# Patient Record
Sex: Female | Born: 1965 | Race: Black or African American | Hispanic: No | Marital: Single | Smoking: Never smoker
Health system: Southern US, Community
[De-identification: ages and names within clinical notes are randomized; demographics above are authoritative.]

---

## 2008-10-16 ENCOUNTER — Emergency Department (HOSPITAL_BASED_OUTPATIENT_CLINIC_OR_DEPARTMENT_OTHER): Admission: EM | Admit: 2008-10-16 | Discharge: 2008-10-16 | Payer: Self-pay | Admitting: Emergency Medicine

## 2009-02-24 ENCOUNTER — Ambulatory Visit: Payer: Self-pay | Admitting: Radiology

## 2009-02-24 ENCOUNTER — Emergency Department (HOSPITAL_BASED_OUTPATIENT_CLINIC_OR_DEPARTMENT_OTHER): Admission: EM | Admit: 2009-02-24 | Discharge: 2009-02-24 | Payer: Self-pay | Admitting: Emergency Medicine

## 2016-11-13 ENCOUNTER — Emergency Department (HOSPITAL_BASED_OUTPATIENT_CLINIC_OR_DEPARTMENT_OTHER)
Admission: EM | Admit: 2016-11-13 | Discharge: 2016-11-14 | Disposition: A | Discharge status: Home / Self Care | Payer: Self-pay | Attending: Emergency Medicine | Admitting: Emergency Medicine

## 2016-11-13 ENCOUNTER — Encounter (HOSPITAL_BASED_OUTPATIENT_CLINIC_OR_DEPARTMENT_OTHER): Payer: Self-pay | Admitting: *Deleted

## 2016-11-13 ENCOUNTER — Emergency Department (HOSPITAL_BASED_OUTPATIENT_CLINIC_OR_DEPARTMENT_OTHER): Payer: Self-pay

## 2016-11-13 DIAGNOSIS — Y939 Activity, unspecified: Secondary | ICD-10-CM | POA: Insufficient documentation

## 2016-11-13 DIAGNOSIS — Y999 Unspecified external cause status: Secondary | ICD-10-CM | POA: Insufficient documentation

## 2016-11-13 DIAGNOSIS — X509XXA Other and unspecified overexertion or strenuous movements or postures, initial encounter: Secondary | ICD-10-CM | POA: Insufficient documentation

## 2016-11-13 DIAGNOSIS — S72414A Nondisplaced unspecified condyle fracture of lower end of right femur, initial encounter for closed fracture: Secondary | ICD-10-CM

## 2016-11-13 DIAGNOSIS — Y929 Unspecified place or not applicable: Secondary | ICD-10-CM | POA: Insufficient documentation

## 2016-11-13 MED ORDER — HYDROCODONE-ACETAMINOPHEN 5-325 MG PO TABS
1.0000 | ORAL_TABLET | Freq: Once | ORAL | Status: AC
  Administered 2016-11-13: 1 via ORAL
  Filled 2016-11-13: qty 1

## 2016-11-13 MED ORDER — IBUPROFEN 400 MG PO TABS
400.0000 mg | ORAL_TABLET | Freq: Once | ORAL | Status: AC | PRN
  Administered 2016-11-13: 400 mg via ORAL
  Filled 2016-11-13: qty 1

## 2016-11-13 NOTE — ED Triage Notes (Signed)
She tripped and fell. Injury to her right knee.

## 2016-11-13 NOTE — ED Notes (Signed)
Patient transported to X-ray 

## 2016-11-13 NOTE — ED Notes (Signed)
Patient transported to CT 

## 2016-11-13 NOTE — ED Provider Notes (Signed)
[facility] Provider [facility]   CSN: [id number] Arrival date & time: 11/13/16  2141  By signing my name below, I, Rosana Fret, attest that this documentation has been prepared under the direction and in the presence of Pricilla Loveless, MD.Electronically Signed: Rosana Fret, ED Scribe. 11/13/16. 10:26 PM.  History   Chief Complaint Chief Complaint  Patient presents with  . Fall  . Knee Pain   The history is provided by the patient. No language interpreter was used.   HPI Comments: Krista Ho is an otherwise healthy 51 y.o. female who presents to the Emergency Department complaining of sudden onset, moderate right knee pain s/p a mechanical. Ground-level fall that occurred tonight. Pt states she tripped over a curb, twisting her knee. No head injury or LOC.  Pt was able to walk but with pain. Pt reports associated swelling to the area. No treatments tried PTA. Pt denies numbness/tingling or any other complaints at this time.  History reviewed. No pertinent past medical history.  There are no active problems to display for this patient.   History reviewed. No pertinent surgical history.  OB History    No data available       Home Medications    Prior to Admission medications   Medication Sig Start Date End Date Taking? Authorizing Provider  HYDROcodone-acetaminophen (NORCO) 5-325 MG tablet Take 1-2 tablets by mouth every 4 (four) hours as needed for severe pain. 11/14/16   Pricilla Loveless, MD    Family History No family history on file.  Social History Social History  Substance Use Topics  . Smoking status: Never Smoker  . Smokeless tobacco: Never Used  . Alcohol use No     Allergies   Patient has no known allergies.   Review of Systems Review of Systems  Musculoskeletal: Positive for arthralgias, joint swelling and myalgias.  Neurological: Negative for syncope and numbness.     Physical Exam Updated Vital Signs BP (!) 113/97   Pulse  93   Temp 99.2 F (37.3 C) (Oral)   Resp 20   Ht 5' 6.5" (1.689 m)   Wt 90.7 kg (200 lb)   LMP 10/14/2016   SpO2 100%   BMI 31.80 kg/m   Physical Exam  Constitutional: She is oriented to person, place, and time. She appears well-developed and well-nourished.  HENT:  Head: Normocephalic and atraumatic.  Right Ear: External ear normal.  Left Ear: External ear normal.  Nose: Nose normal.  Eyes: Right eye exhibits no discharge. Left eye exhibits no discharge.  Cardiovascular: Normal rate and regular rhythm.   Pulmonary/Chest: Effort normal.  Musculoskeletal: Normal range of motion. She exhibits edema and tenderness. She exhibits no deformity.  Mild infrapatellar swelling of the right knee. Point tenderness to the right medial inferior knee. No patella tenderness. Normal active and passive ROM. No ligament laxity. 2+ DP pulse. Normal sensation and strength in RLE.   Neurological: She is alert and oriented to person, place, and time.  Skin: Skin is warm and dry.  Nursing note and vitals reviewed.    ED Treatments / Results  DIAGNOSTIC STUDIES: Oxygen Saturation is 100% on RA, normal by my interpretation.   COORDINATION OF CARE: 10:24 PM-Discussed next steps with pt including RICE protocol. Pt verbalized understanding and is agreeable with the plan.   Labs (all labs ordered are listed, but only abnormal results are displayed) Labs Reviewed - No data to display  EKG  EKG Interpretation None       Radiology  Ct Knee Right Wo Contrast  Result Date: 11/14/2016 CLINICAL DATA:  Right knee pain after recent fall. EXAM: CT OF THE RIGHT KNEE WITHOUT CONTRAST TECHNIQUE: Multidetector CT imaging of the RIGHT knee was performed according to the standard protocol. Multiplanar CT image reconstructions were also generated. COMPARISON:  Same day radiographs of the right knee. FINDINGS: Bones/Joint/Cartilage There is an acute impaction fracture involving the anterior cortex of the medial  femoral condyle with approximately 4 mm of depression noted, series 9, image 43. The area of impaction measures approximately 16 x 11 x 13 mm. There is a tiny avulsion fracture involving the posterior tibial plateau possibly involving the base of the posterior cruciate ligament, series 9, image 38. Small suprapatellar joint effusion. Degenerative subchondral cysts are noted along the posterior aspect of the lateral tibial plateau with subcortical degenerative cysts involving the posterior aspect of the medial femoral condyle. Ligaments Suboptimally assessed by CT. Muscles and Tendons Negative Soft tissues Negative IMPRESSION: 1. Acute impaction fracture involving the anterior cortex of the medial femoral condyle with approximately 4 mm of depression noted. The affected area covers approximately 16 x 11 x 13 mm of the surface. 2. Tiny avulsion fracture off the posteromesial tibial plateau along the posterior aspect of the PCL insertion. Electronically Signed   By: Tollie Eth M.D.   On: 11/14/2016 00:08   Dg Knee Complete 4 Views Right  Result Date: 11/13/2016 CLINICAL DATA:  Right anterior knee pain after fall 30 minutes ago. EXAM: RIGHT KNEE - COMPLETE 4+ VIEW COMPARISON:  None. FINDINGS: Slight medial femorotibial joint space narrowing with small to moderate joint effusion. Subchondral degenerate cystic change noted deep to the lateral tibial plateau with spurring off the lateral tibial plateau noted. Small rim of osteophytes are suggested about the medial tibial plateau. A subtle mild impaction injury to the medial tibial plateau is not excluded that may account for this appearance however no definite fracture lucencies are seen. A noncontrast CT can be performed for further correlation as deemed clinically necessary IMPRESSION: Small to moderate joint effusion. Osteoarthritis of the femorotibial compartment. Small rim of bone along the periphery the medial tibial plateau likely represents a small rim of  osteophytes. A subtle impaction fracture might also account for this appearance but no definite fracture lucencies are seen. A CT can be performed as clinically necessary. Electronically Signed   By: Tollie Eth M.D.   On: 11/13/2016 22:22    Procedures Procedures (including critical care time)  Medications Ordered in ED Medications  ibuprofen (ADVIL,MOTRIN) tablet 400 mg (400 mg Oral Given 11/13/16 2218)  HYDROcodone-acetaminophen (NORCO/VICODIN) 5-325 MG per tablet 1 tablet (1 tablet Oral Given 11/13/16 2358)     Initial Impression / Assessment and Plan / ED Course  I have reviewed the triage vital signs and the nursing notes.  Pertinent labs & imaging results that were available during my care of the patient were reviewed by me and considered in my medical decision making (see chart for details).     CT of the knee shows small femoral condyle impaction fracture and possible posterior medial tibial plateau fracture. Discussed with orthopedics, Dr. Eulah Pont who has reviewed the images. He recommends knee immobilizer, no weightbearing, and pain control. He wants to see the patient in his office this week but at this point does not think there needs to be in acute surgical intervention. Otherwise has mild knee swelling but no significant swelling in her compartments or firm compartments. Discussed return precautions.  Final Clinical  Impressions(s) / ED Diagnoses   Final diagnoses:  Traumatic closed nondisplaced fracture of femoral condyle, right, initial encounter (HCC)    New Prescriptions New Prescriptions   HYDROCODONE-ACETAMINOPHEN (NORCO) 5-325 MG TABLET    Take 1-2 tablets by mouth every 4 (four) hours as needed for severe pain.   I personally performed the services described in this documentation, which was scribed in my presence. The recorded information has been reviewed and is accurate.     Pricilla LovelessGoldston, Dailynn Nancarrow, MD 11/14/16 [phone]

## 2016-11-14 MED ORDER — HYDROCODONE-ACETAMINOPHEN 5-325 MG PO TABS: 1.0000 | ORAL_TABLET | ORAL | 0 refills | Status: AC | PRN

## 2016-11-14 NOTE — ED Notes (Signed)
Pt was wheeled out in a wheel chair

## 2016-11-14 NOTE — Discharge Instructions (Signed)
Call Dr. Greig RightMurphy's office in the morning to set up an appointment later this week. Wear the knee immobilizer at all times. Do not touch your foot on the ground or bear weight with that leg. Use the crutches when you are walking around. Otherwise keep your knee elevated and apply ice.

## 2018-08-03 IMAGING — CT CT KNEE*R* W/O CM
3 of 4 series · 15 of 33 positions shown, 18 images · non-contrast
Comparison: Same day radiographs of the right knee.

CLINICAL DATA: Right knee pain after recent fall.

EXAM:
CT OF THE RIGHT KNEE WITHOUT CONTRAST
TECHNIQUE: Multidetector CT imaging of the RIGHT knee was performed according
to the standard protocol. Multiplanar CT image reconstructions were
also generated.

[Series 4: axial st · axial · 0.42mm/px · z∈[-274,-106]mm · 7 of 109 slices shown, 9 images]
[im 13/109  soft-tissue]
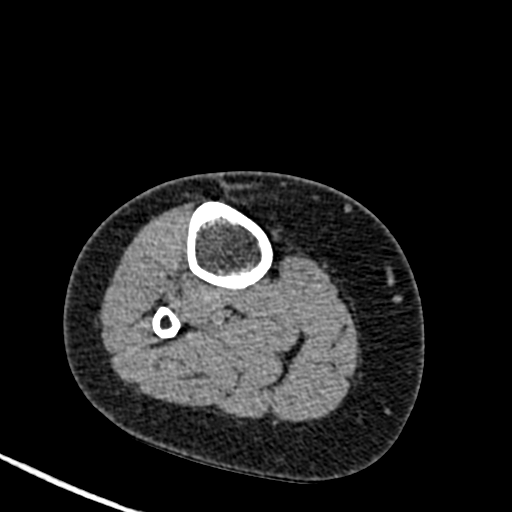
[im 13/109  bone]
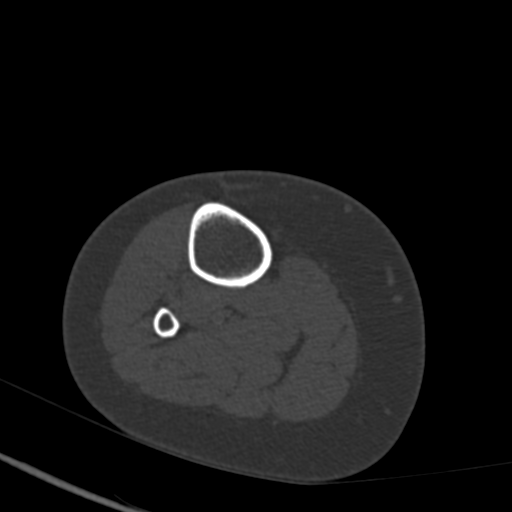
[im 25/109  bone]
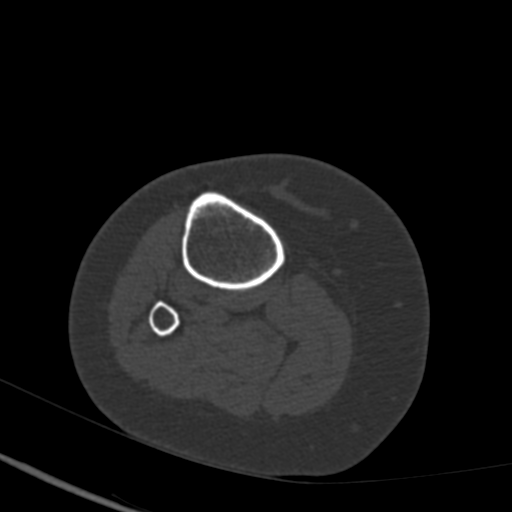
[im 37/109  bone]
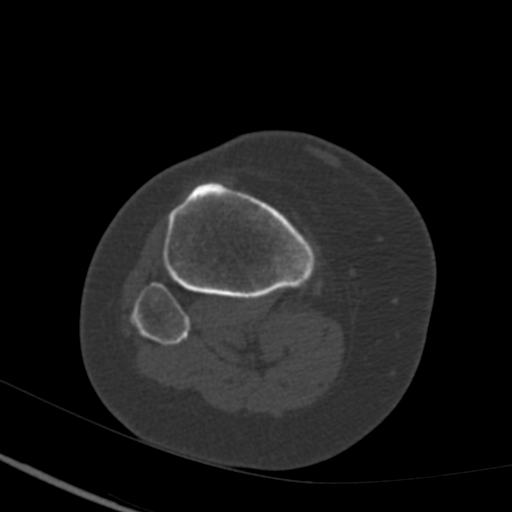
[im 61/109  bone]
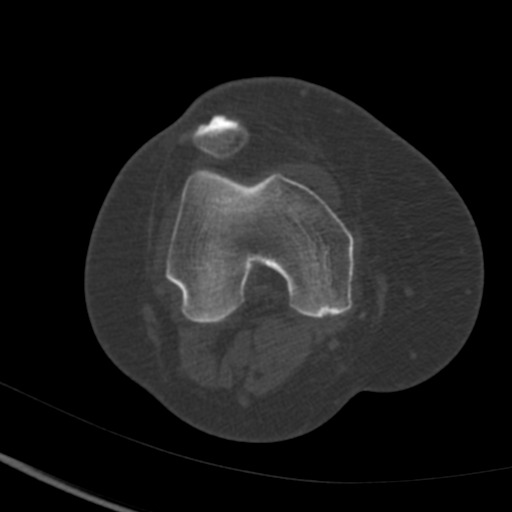
[im 73/109  soft-tissue]
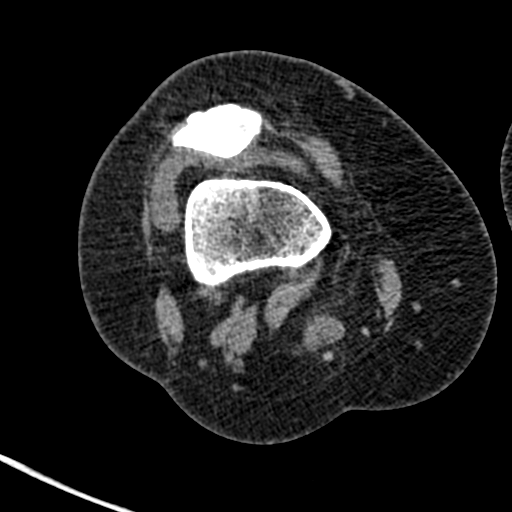
[im 73/109  bone]
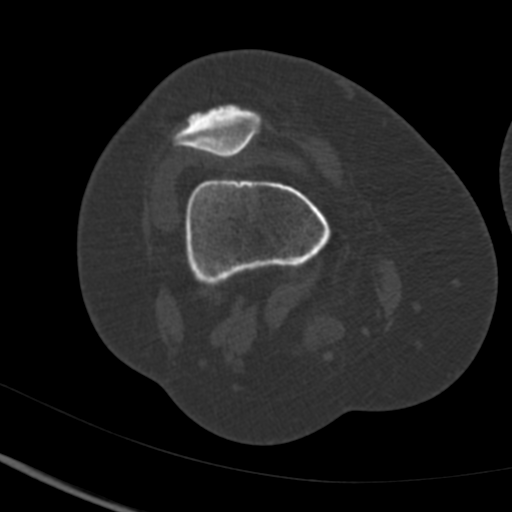
[im 85/109  bone]
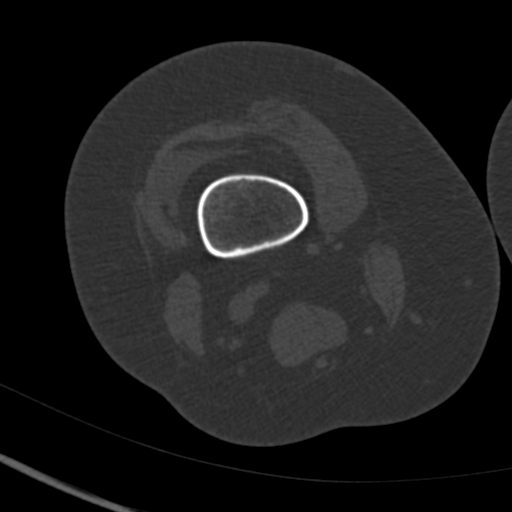
[im 97/109  bone]
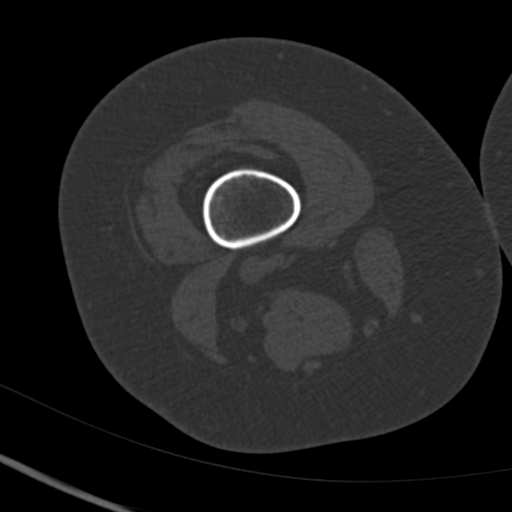

[Series 8: coronal st · coronal · 0.38mm/px · 3 of 91 slices shown]
[im 19/91  bone]
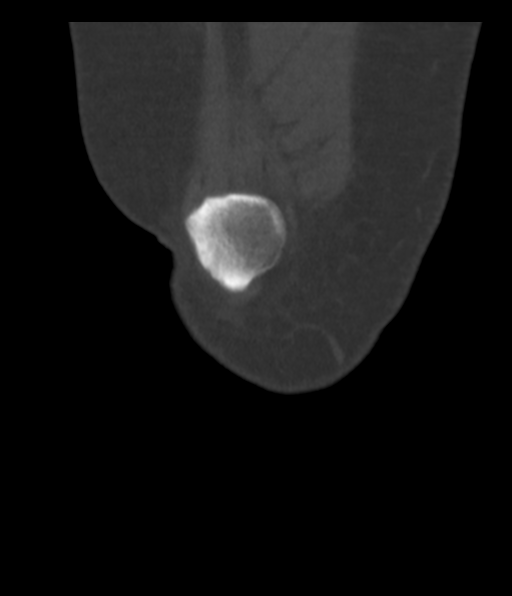
[im 37/91  bone]
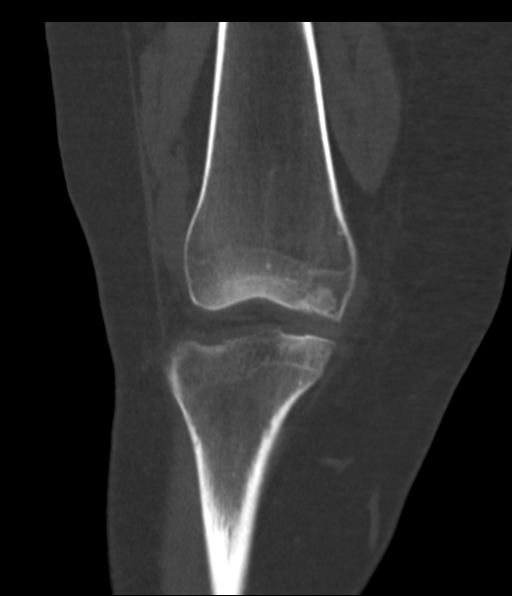
[im 55/91  bone]
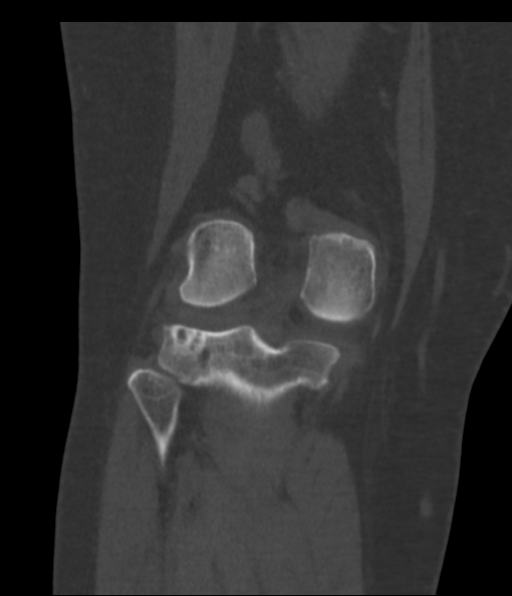

[Series 10: sagittal st · sagittal · 0.39mm/px · 5 of 55 slices shown, 6 images]
[im 19/55  bone]
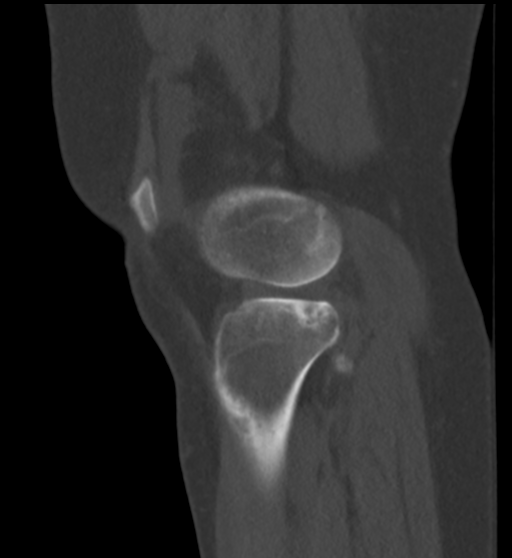
[im 23/55  bone]
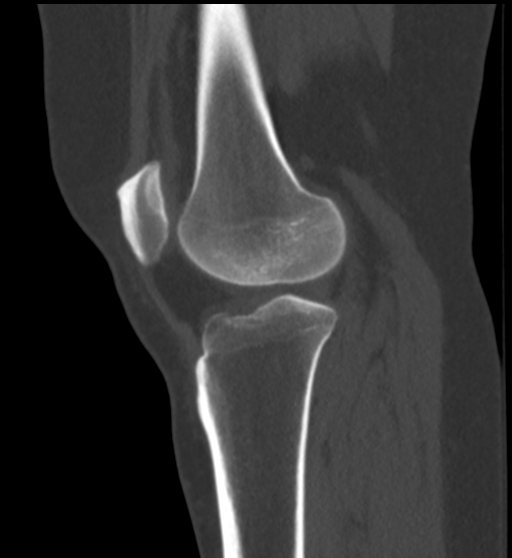
[im 28/55  soft-tissue]
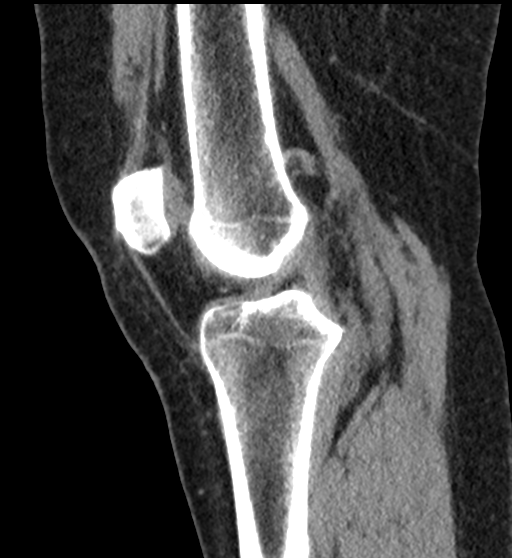
[im 28/55  bone]
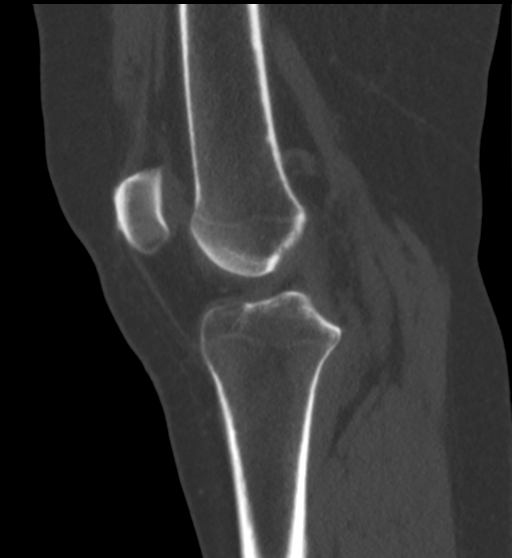
[im 32/55  bone]
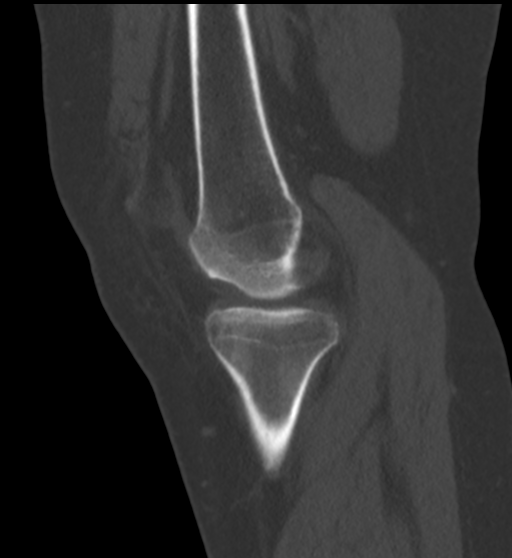
[im 37/55  bone]
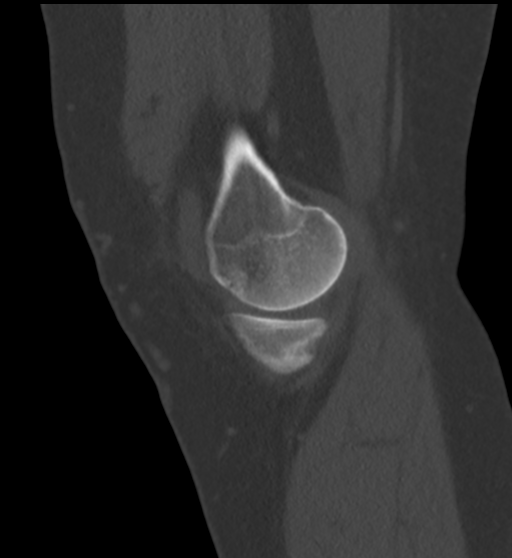

[15 of 33 positions shown; findings below may reference images not displayed]

FINDINGS: Bones/Joint/Cartilage

There is an acute impaction fracture involving the anterior cortex
of the medial femoral condyle with approximately 4 mm of depression
noted, series 9, image 43. The area of impaction measures
approximately 16 x 11 x 13 mm. There is a tiny avulsion fracture
involving the posterior tibial plateau possibly involving the base
of the posterior cruciate ligament, series 9, image 38.

Small suprapatellar joint effusion.

Degenerative subchondral cysts are noted along the posterior aspect
of the lateral tibial plateau with subcortical degenerative cysts
involving the posterior aspect of the medial femoral condyle.

Ligaments

Suboptimally assessed by CT.

Muscles and Tendons

Negative

Soft tissues

Negative
IMPRESSION: 1. Acute impaction fracture involving the anterior cortex of the
medial femoral condyle with approximately 4 mm of depression noted.
The affected area covers approximately 16 x 11 x 13 mm of the
surface.
2. Tiny avulsion fracture off the posteromesial tibial plateau along
the posterior aspect of the PCL insertion.

## 2018-08-03 IMAGING — DX DG KNEE COMPLETE 4+V*R*
4 series · 4 of 4 positions shown · non-contrast
Comparison: None.

CLINICAL DATA: Right anterior knee pain after fall 30 minutes ago.

EXAM:
RIGHT KNEE - COMPLETE 4+ VIEW

[knee ap]
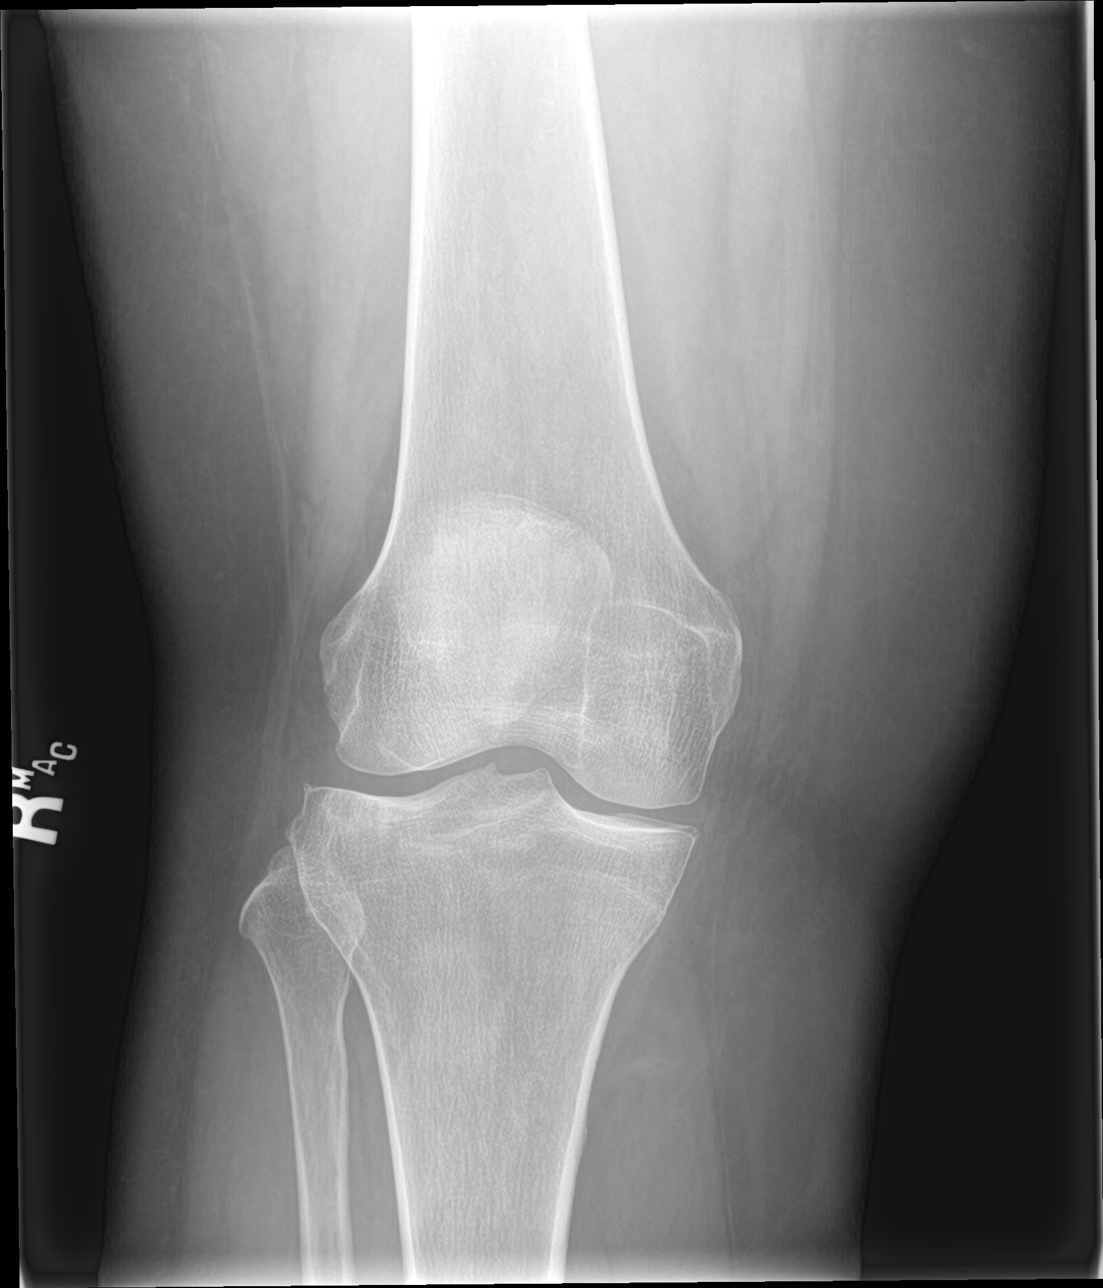

[knee lat]
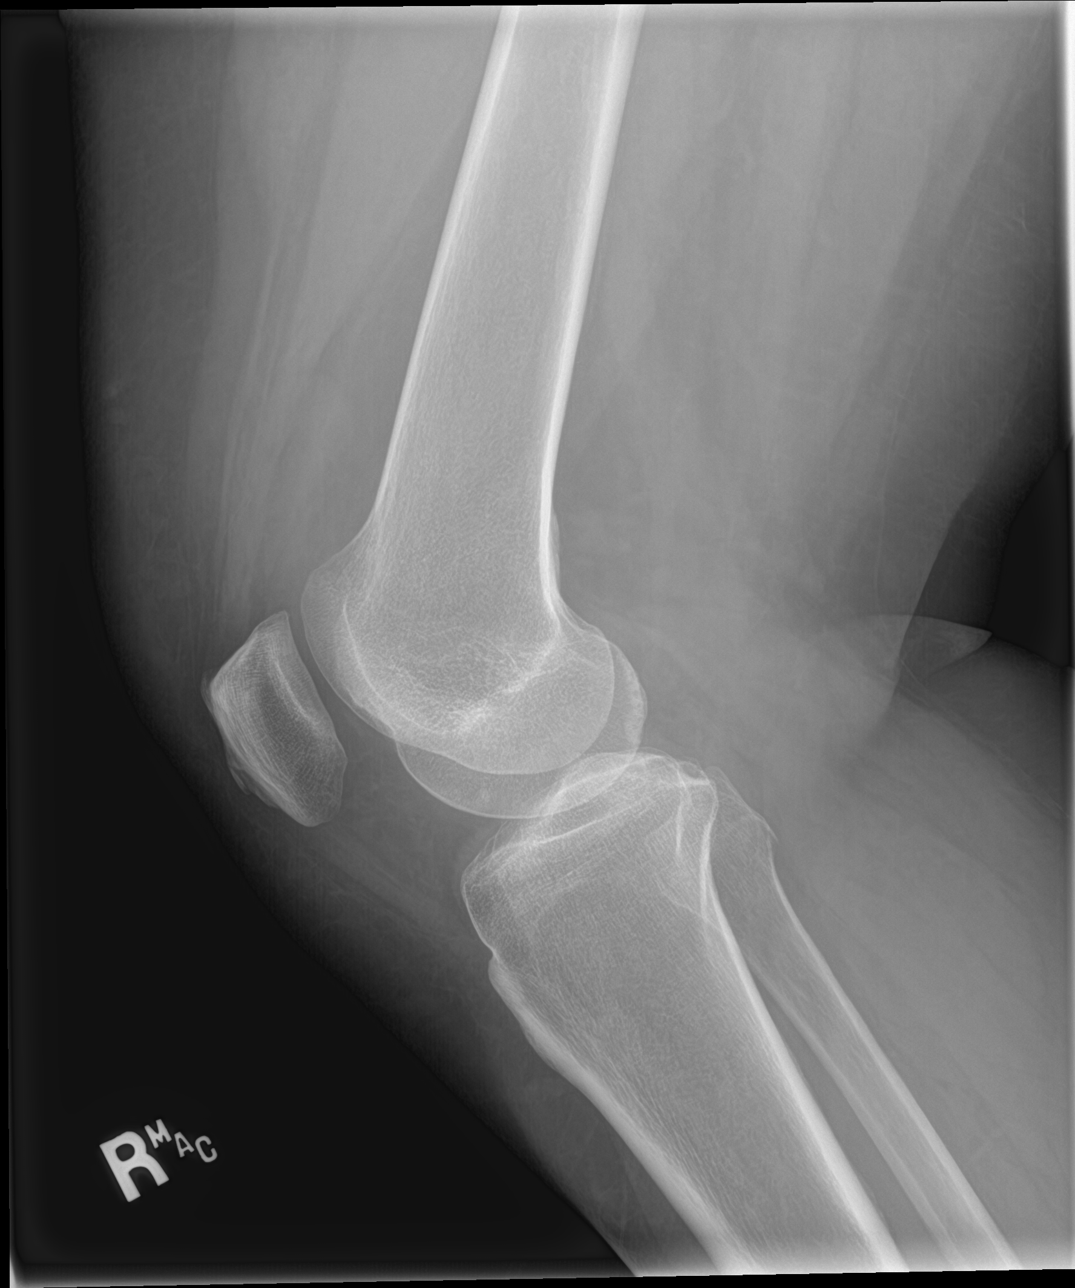

[knee obl (1 of 2)]
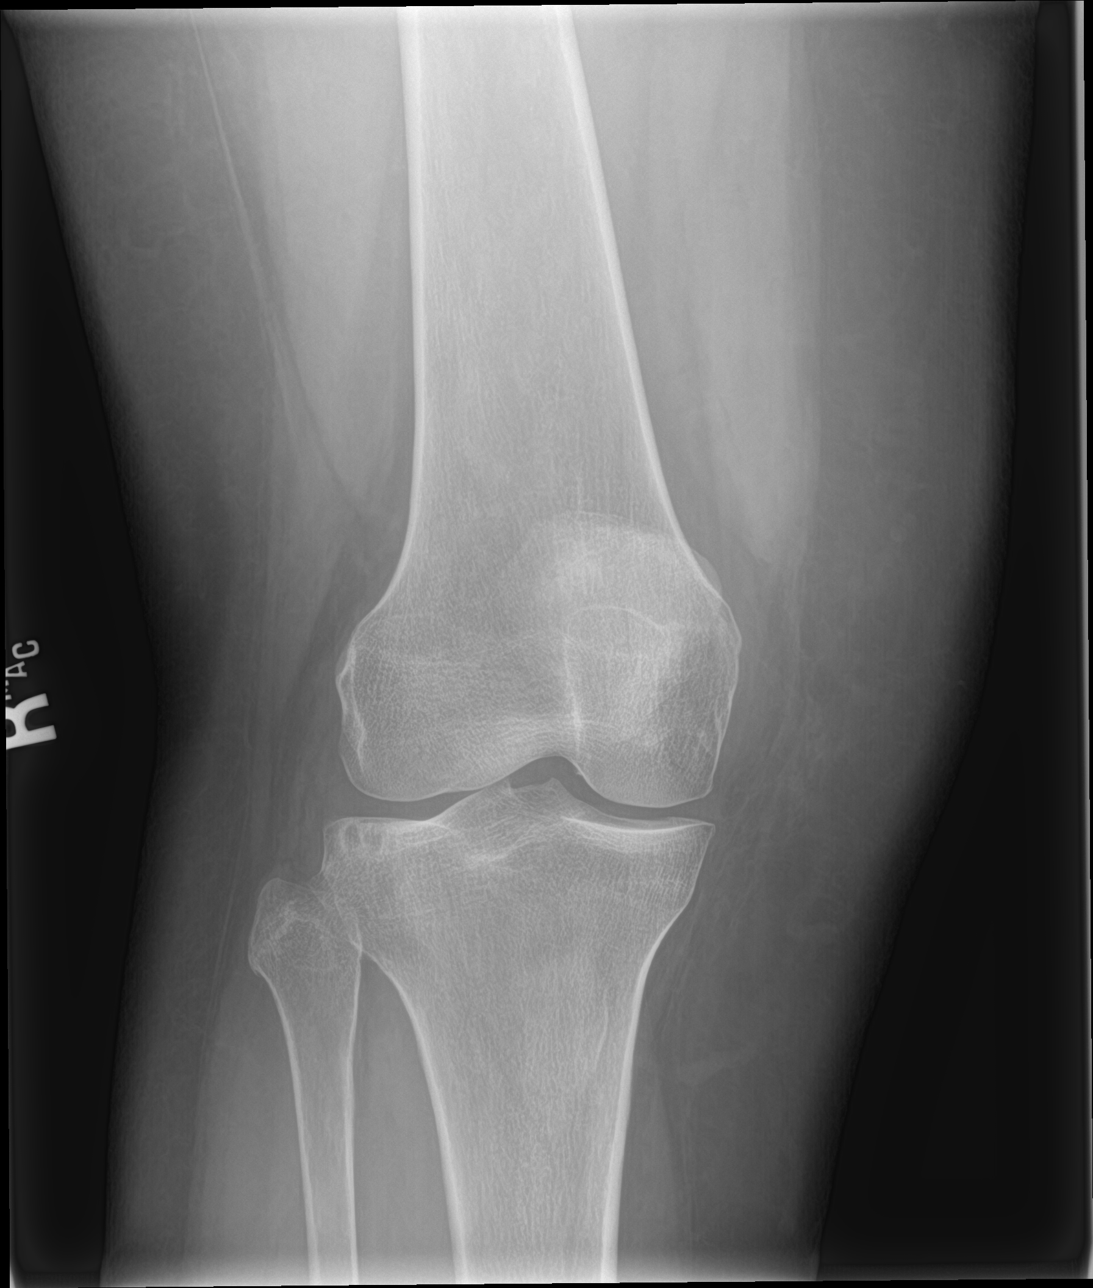

[knee obl (2 of 2)]
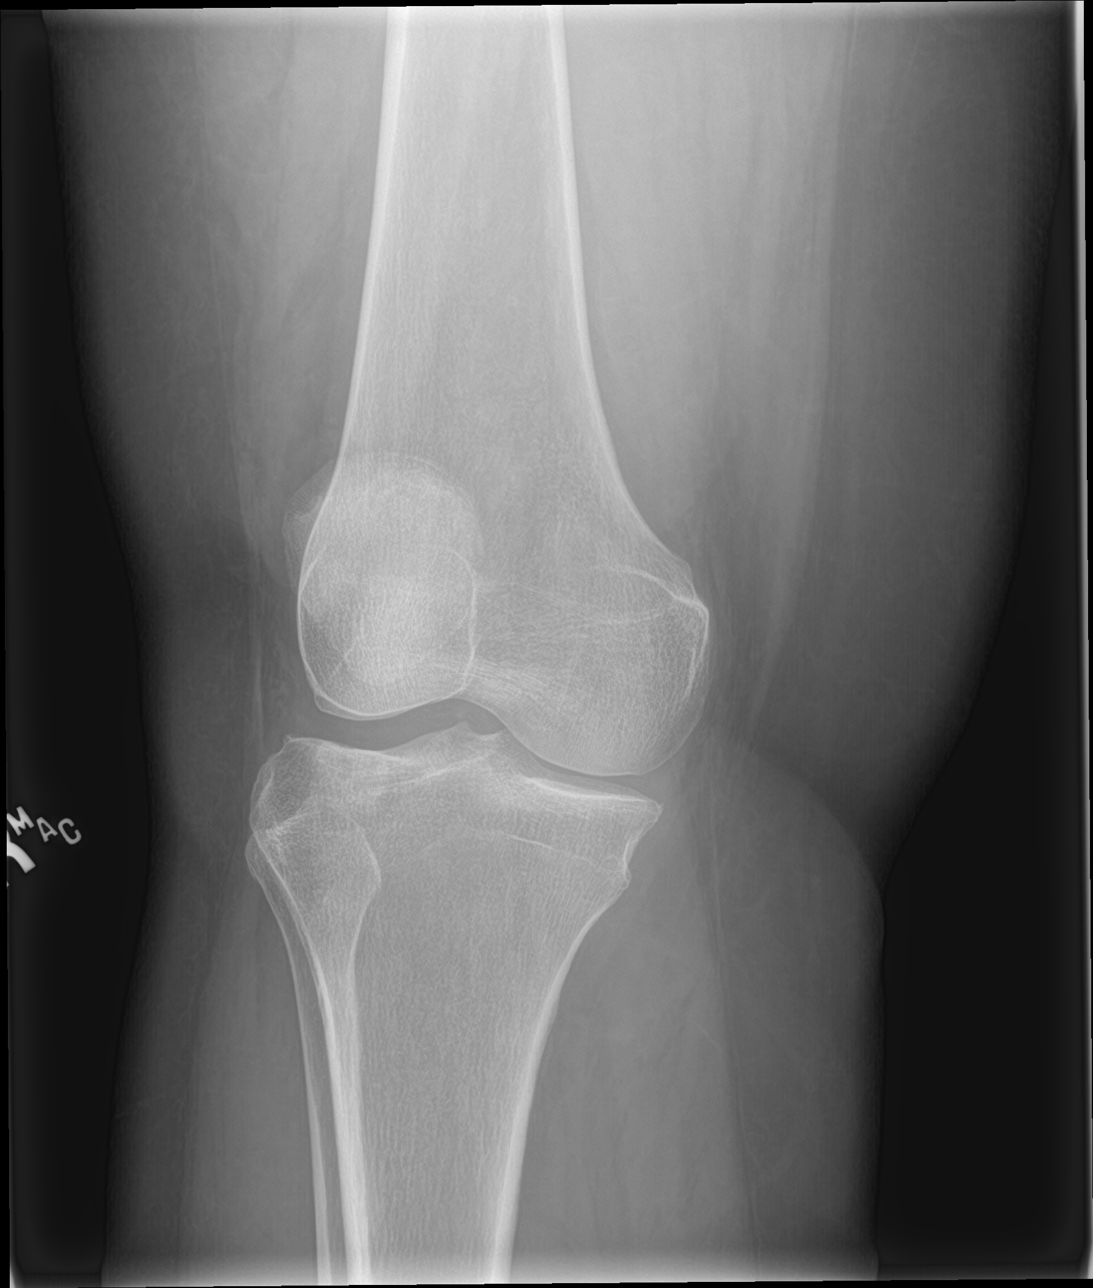

[4 of 4 positions shown; findings below may reference images not displayed]

FINDINGS: Slight medial femorotibial joint space narrowing with small to
moderate joint effusion. Subchondral degenerate cystic change noted
deep to the lateral tibial plateau with spurring off the lateral
tibial plateau noted. Small rim of osteophytes are suggested about
the medial tibial plateau. A subtle mild impaction injury to the
medial tibial plateau is not excluded that may account for this
appearance however no definite fracture lucencies are seen. A
noncontrast CT can be performed for further correlation as deemed
clinically necessary
IMPRESSION: Small to moderate joint effusion.

Osteoarthritis of the femorotibial compartment.

Small rim of bone along the periphery the medial tibial plateau
likely represents a small rim of osteophytes. A subtle impaction
fracture might also account for this appearance but no definite
fracture lucencies are seen. A CT can be performed as clinically
necessary.

## 2022-04-17 ENCOUNTER — Emergency Department (HOSPITAL_BASED_OUTPATIENT_CLINIC_OR_DEPARTMENT_OTHER)
Admission: EM | Admit: 2022-04-17 | Discharge: 2022-04-17 | Disposition: A | Discharge status: Home / Self Care | Payer: Commercial Managed Care - HMO | Attending: Emergency Medicine | Admitting: Emergency Medicine

## 2022-04-17 ENCOUNTER — Encounter (HOSPITAL_BASED_OUTPATIENT_CLINIC_OR_DEPARTMENT_OTHER): Payer: Self-pay

## 2022-04-17 DIAGNOSIS — R220 Localized swelling, mass and lump, head: Secondary | ICD-10-CM | POA: Insufficient documentation

## 2022-04-17 DIAGNOSIS — T7840XA Allergy, unspecified, initial encounter: Secondary | ICD-10-CM | POA: Diagnosis not present

## 2022-04-17 MED ORDER — METHYLPREDNISOLONE SODIUM SUCC 125 MG IJ SOLR
125.0000 mg | Freq: Once | INTRAMUSCULAR | Status: AC
  Administered 2022-04-17: 125 mg via INTRAVENOUS
  Filled 2022-04-17: qty 2

## 2022-04-17 MED ORDER — EPINEPHRINE 0.3 MG/0.3ML IJ SOAJ: 0.3000 mg | INTRAMUSCULAR | 0 refills | Status: AC | PRN

## 2022-04-17 NOTE — Discharge Instructions (Signed)
Take another Benadryl around noon or 1 if you continue to have tongue swelling.  The steroid should be in your system for 24 hours.  You were given a prescription for an EpiPen that you would use if you started feeling like her throat was swelling or you could not breathe.  If you feel like the swelling is worsening return to the ER.  Avoid any foods that you think may cause an issue.

## 2022-04-17 NOTE — ED Provider Notes (Signed)
[facility] EMERGENCY DEPARTMENT Provider Note   CSN: [id number] Arrival date & time: 04/17/22  0802     History  Chief Complaint  Patient presents with   Oral Swelling    Krista Ho is a 56 y.o. female.  Patient is a healthy 56 year old female with no significant medical problems who is presenting today with complaints of swelling to the left side of her tongue that started this morning when she woke up she noticed it.  She cannot recall any specific cause for this except she did eat something spicy last night.  On occasion she has eaten things and will cause some itching hearing there but cannot recall any specific thing that she ate.  She denies any involvement of her lips or throat it is just the left side of her tongue.  She did take 25 mg of Benadryl prior to arrival.  She denies any itching of her skin or hives.  She reports the left side of her tongue itches just a Albertsen bit.  No difficulty swallowing or breathing at this time.  She has no medication allergies.  She does not take any ACE inhibitors or ARB's.  The history is provided by the patient.       Home Medications Prior to Admission medications   Medication Sig Start Date End Date Taking? Authorizing Provider  EPINEPHrine 0.3 mg/0.3 mL IJ SOAJ injection Inject 0.3 mg into the muscle as needed for anaphylaxis. 04/17/22  Yes Cayne Yom, Alphonzo Lemmings, MD  HYDROcodone-acetaminophen (NORCO) 5-325 MG tablet Take 1-2 tablets by mouth every 4 (four) hours as needed for severe pain. 11/14/16   Pricilla Loveless, MD      Allergies    Patient has no known allergies.    Review of Systems   Review of Systems  Physical Exam Updated Vital Signs BP 124/78 (BP Location: Right Arm)   Pulse 80   Temp 97.9 F (36.6 C) (Oral)   Resp 18   Ht 5' 6.5" (1.689 m)   Wt 108.9 kg   SpO2 100%   BMI 38.16 kg/m  Physical Exam Vitals and nursing note reviewed.  Constitutional:      General: She is not in acute distress.     Appearance: She is well-developed.  HENT:     Head: Normocephalic and atraumatic.     Mouth/Throat:   Eyes:     Pupils: Pupils are equal, round, and reactive to light.  Cardiovascular:     Rate and Rhythm: Normal rate and regular rhythm.     Heart sounds: Normal heart sounds. No murmur heard.    No friction rub.  Pulmonary:     Effort: Pulmonary effort is normal.     Breath sounds: Normal breath sounds. No wheezing or rales.  Musculoskeletal:        General: No tenderness. Normal range of motion.     Comments: No edema  Skin:    General: Skin is warm and dry.     Findings: No rash.  Neurological:     Mental Status: She is alert and oriented to person, place, and time.     Cranial Nerves: No cranial nerve deficit.  Psychiatric:        Mood and Affect: Mood normal.        Behavior: Behavior normal.     ED Results / Procedures / Treatments   Labs (all labs ordered are listed, but only abnormal results are displayed) Labs Reviewed - No data to display  EKG None  Radiology No results found.  Procedures Procedures    Medications Ordered in ED Medications  methylPREDNISolone sodium succinate (SOLU-MEDROL) 125 mg/2 mL injection 125 mg (125 mg Intravenous Given 04/17/22 0834)    ED Course/ Medical Decision Making/ A&P                           Medical Decision Making Risk Prescription drug management.   Pt presenting today with a complaint that caries a high risk for morbidity and mortality.  Presenting today with unilateral swelling of the left side of her tongue.  Does not appear to have any uvula or pharyngeal involvement.  No wheezing, stridor or trouble breathing.  Possibility of food allergy versus angioedema.  Patient is already taken Benadryl.  Will give steroids and observe.  At this time epi is not indicated. 9:25 AM Swelling is improving.  Will d/c pt home.  Given epipen for the future and pt to follow up with allergist.        Final Clinical  Impression(s) / ED Diagnoses Final diagnoses:  Tongue swelling  Allergic reaction, initial encounter    Rx / DC Orders ED Discharge Orders          Ordered    EPINEPHrine 0.3 mg/0.3 mL IJ SOAJ injection  As needed        04/17/22 0924              Gwyneth Sprout, MD 04/17/22 [phone]

## 2022-04-17 NOTE — ED Triage Notes (Signed)
Swelling to left side of tongue since waking up this morning. States some trouble swallowing. Unknown allergen. Took 25mg  benadryl & ibuorofen pta.

## 2022-04-17 NOTE — ED Notes (Signed)
ED Provider at bedside. 

## 2022-06-03 ENCOUNTER — Emergency Department (HOSPITAL_BASED_OUTPATIENT_CLINIC_OR_DEPARTMENT_OTHER)
Admission: EM | Admit: 2022-06-03 | Discharge: 2022-06-03 | Disposition: A | Discharge status: Home / Self Care | Payer: BLUE CROSS/BLUE SHIELD | Attending: Emergency Medicine | Admitting: Emergency Medicine

## 2022-06-03 ENCOUNTER — Encounter (HOSPITAL_BASED_OUTPATIENT_CLINIC_OR_DEPARTMENT_OTHER): Payer: Self-pay | Admitting: Emergency Medicine

## 2022-06-03 ENCOUNTER — Other Ambulatory Visit: Payer: Self-pay

## 2022-06-03 DIAGNOSIS — U071 COVID-19: Secondary | ICD-10-CM | POA: Insufficient documentation

## 2022-06-03 DIAGNOSIS — R059 Cough, unspecified: Secondary | ICD-10-CM | POA: Diagnosis present

## 2022-06-03 LAB — RESP PANEL BY RT-PCR (RSV, FLU A&B, COVID)  RVPGX2
Influenza A by PCR: NEGATIVE
Influenza B by PCR: NEGATIVE
Resp Syncytial Virus by PCR: NEGATIVE
SARS Coronavirus 2 by RT PCR: POSITIVE — AB

## 2022-06-03 NOTE — Discharge Instructions (Signed)
Please return to ED with any new or worsening signs or symptoms Please read attached guide concerning COVID-19 as well as quarantine and isolation Please follow-up with PCP for ongoing management or needs Please treat symptoms conservatively at home utilizing ibuprofen, Tylenol, NyQuil, DayQuil, Zicam. Please read attached work note

## 2022-06-03 NOTE — ED Triage Notes (Signed)
Patient presents to ED via POV from home. Here with cough and nasal congestion x 4 days.  

## 2022-06-03 NOTE — ED Provider Notes (Signed)
[facility] Provider Note   CSN: [id number] Arrival date & time: 06/03/22  1401     History  Chief Complaint  Patient presents with   Cough    Benna Arno is a 57 y.o. female with no documented medical history.  Patient presents ED for evaluation of URI symptoms for the last 3 to 4 days.  Patient complaining of cough, runny nose.  Patient denies sore throat, fever, nausea, vomiting, diarrhea, shortness of breath or chest pain.  Patient reports she has been taking ibuprofen and allergy medication at home which will relieve symptoms.  Patient states she does have positive sick contact in the form of her son.      Cough Associated symptoms: rhinorrhea   Associated symptoms: no chest pain, no fever, no shortness of breath and no sore throat        Home Medications Prior to Admission medications   Medication Sig Start Date End Date Taking? Authorizing Provider  EPINEPHrine 0.3 mg/0.3 mL IJ SOAJ injection Inject 0.3 mg into the muscle as needed for anaphylaxis. 04/17/22   Blanchie Dessert, MD  HYDROcodone-acetaminophen (NORCO) 5-325 MG tablet Take 1-2 tablets by mouth every 4 (four) hours as needed for severe pain. 11/14/16   Sherwood Gambler, MD      Allergies    Patient has no known allergies.    Review of Systems   Review of Systems  Constitutional:  Negative for fever.  HENT:  Positive for rhinorrhea. Negative for sore throat.   Respiratory:  Positive for cough. Negative for shortness of breath.   Cardiovascular:  Negative for chest pain.  Gastrointestinal:  Negative for diarrhea, nausea and vomiting.  All other systems reviewed and are negative.   Physical Exam Updated Vital Signs BP (!) 147/75   Pulse 84   Temp 97.9 F (36.6 C) (Oral)   Resp 17   Ht 5\' 6"  (1.676 m)   Wt 95.3 kg   SpO2 100%   BMI 33.89 kg/m  Physical Exam Vitals and nursing note reviewed.  Constitutional:      General: She is not in acute  distress.    Appearance: Normal appearance. She is not ill-appearing, toxic-appearing or diaphoretic.  HENT:     Head: Normocephalic and atraumatic.     Nose: Nose normal. No congestion.     Mouth/Throat:     Mouth: Mucous membranes are moist.     Pharynx: Oropharynx is clear. No oropharyngeal exudate or posterior oropharyngeal erythema.  Eyes:     Extraocular Movements: Extraocular movements intact.     Conjunctiva/sclera: Conjunctivae normal.     Pupils: Pupils are equal, round, and reactive to light.  Cardiovascular:     Rate and Rhythm: Normal rate and regular rhythm.  Pulmonary:     Effort: Pulmonary effort is normal.     Breath sounds: Normal breath sounds. No wheezing.  Abdominal:     General: Abdomen is flat. Bowel sounds are normal.     Palpations: Abdomen is soft.     Tenderness: There is no abdominal tenderness.  Musculoskeletal:     Cervical back: Normal range of motion and neck supple. No tenderness.  Skin:    General: Skin is warm and dry.     Capillary Refill: Capillary refill takes less than 2 seconds.  Neurological:     Mental Status: She is alert and oriented to person, place, and time.     ED Results / Procedures / Treatments  Labs (all labs ordered are listed, but only abnormal results are displayed) Labs Reviewed  RESP PANEL BY RT-PCR (RSV, FLU A&B, COVID)  [id number] - Abnormal; Notable for the following components:      Result Value   SARS Coronavirus 2 by RT PCR POSITIVE (*)    All other components within normal limits    EKG None  Radiology No results found.  Procedures Procedures   Medications Ordered in ED Medications - No data to display  ED Course/ Medical Decision Making/ A&P  Medical Decision Making  57 year old female presents to ED for evaluation.  Please see HPI for further details.  On examination patient is afebrile and nontachycardic.  Patient lung sounds are clear bilaterally, she is not hypoxic on room air.  Abdomen soft  and compressible throughout.  Patient posterior oropharynx is nonerythematous, no exudate.  Uvula midline, handling secretions appropriately.  Patient nontoxic.  Patient positive for COVID-19.  Patient unfortunately outside of window for antiviral medication.  Patient advised to treat symptoms conservatively at home.  Patient provided return precautions and she voiced understanding.  Patient will be given work note.  Patient discharged in stable condition.   Final Clinical Impression(s) / ED Diagnoses Final diagnoses:  [id number]    Rx / DC Orders ED Discharge Orders     None         Azucena Cecil, PA-C 06/03/22 1608    Tretha Sciara, MD 06/06/22 1557

## 2022-06-12 NOTE — Progress Notes (Deleted)
NEW PATIENT Date of Service/Encounter:  06/12/22 Referring provider: none-self referred Primary care provider: Patient, No Pcp Per  Subjective:  Koree Staheli is a 57 y.o. female with a PMHx of *** presenting today for evaluation of "allergies". History obtained from: chart review and {Persons; PED relatives w/patient:19415::"patient"}.   ED visit 04/17/22-unilateal tongue swelling upon morning awakening. Some tongue itching. Not on ACE-I or ARB. Prescribed steroids and sent home with epipen (none given). Recommended follow-up allergy.   Other allergy screening: Asthma: {Blank single:19197::"yes","no"} Rhino conjunctivitis: {Blank single:19197::"yes","no"} Food allergy: {Blank single:19197::"yes","no"} Medication allergy: {Blank single:19197::"yes","no"} Hymenoptera allergy: {Blank single:19197::"yes","no"} Urticaria: {Blank single:19197::"yes","no"} Eczema:{Blank single:19197::"yes","no"} History of recurrent infections suggestive of immunodeficency: {Blank single:19197::"yes","no"} ***Vaccinations are up to date.   Past Medical History: No past medical history on file. Medication List:  Current Outpatient Medications  Medication Sig Dispense Refill   EPINEPHrine 0.3 mg/0.3 mL IJ SOAJ injection Inject 0.3 mg into the muscle as needed for anaphylaxis. 1 each 0   HYDROcodone-acetaminophen (NORCO) 5-325 MG tablet Take 1-2 tablets by mouth every 4 (four) hours as needed for severe pain. 15 tablet 0   No current facility-administered medications for this visit.   Known Allergies:  No Known Allergies Past Surgical History: No past surgical history on file. Family History: No family history on file. Social History: Junko lives ***.   ROS:  All other systems negative except as noted per HPI.  Objective:  There were no vitals taken for this visit. There is no height or weight on file to calculate BMI. Physical Exam:  General Appearance:  Alert, cooperative, no  distress, appears stated age  Head:  Normocephalic, without obvious abnormality, atraumatic  Eyes:  Conjunctiva clear, EOM's intact  Nose: Nares normal, {Blank multiple:19196:a:"***","hypertrophic turbinates","normal mucosa","no visible anterior polyps","septum midline"}  Throat: Lips, tongue normal; teeth and gums normal, {Blank multiple:19196:a:"***","normal posterior oropharynx","tonsils 2+","tonsils 3+","no tonsillar exudate","+ cobblestoning"}  Neck: Supple, symmetrical  Lungs:   {Blank multiple:19196:a:"***","clear to auscultation bilaterally","end-expiratory wheezing","wheezing throughout"}, Respirations unlabored, {Blank multiple:19196:a:"***","no coughing","intermittent dry coughing"}  Heart:  {Blank multiple:19196:a:"***","regular rate and rhythm","no murmur"}, Appears well perfused  Extremities: No edema  Skin: Skin color, texture, turgor normal, no rashes or lesions on visualized portions of skin  Neurologic: No gross deficits     Diagnostics: Spirometry:  Tracings reviewed. Her effort: {Blank single:19197::"Good reproducible efforts.","It was hard to get consistent efforts and there is a question as to whether this reflects a maximal maneuver.","Poor effort, data can not be interpreted.","Variable effort-results affected.","decent for first attempt at spirometry."} FVC: ***L (pre), ***L  (post) FEV1: ***L, ***% predicted (pre), ***L, ***% predicted (post) FEV1/FVC ratio: *** (pre), *** (post) Interpretation: {Blank single:19197::"Spirometry consistent with mild obstructive disease","Spirometry consistent with moderate obstructive disease","Spirometry consistent with severe obstructive disease","Spirometry consistent with possible restrictive disease","Spirometry consistent with mixed obstructive and restrictive disease","Spirometry uninterpretable due to technique","Spirometry consistent with normal pattern","No overt abnormalities noted given today's efforts"} with ***  bronchodilator response  Skin Testing: {Blank single:19197::"Select foods","Environmental allergy panel","Environmental allergy panel and select foods","Food allergy panel","None","Deferred due to recent antihistamines use"}. *** Adequate controls. Results discussed with patient/family.   {Blank single:19197::"Allergy testing results were read and interpreted by myself, documented by clinical staff."," "}  Assessment and Plan  ***  {Blank single:19197::"This note in its entirety was forwarded to the Provider who requested this consultation."}  Thank you for your kind referral. I appreciate the opportunity to take part in Laetitia's care. Please do not hesitate to contact me with questions.***  Sincerely,  Sigurd Sos, MD [facility]

## 2022-06-14 ENCOUNTER — Ambulatory Visit: Payer: Self-pay | Admitting: Internal Medicine
